# Patient Record
Sex: Female | Born: 1995 | Race: White | Hispanic: No | Marital: Single | Smoking: Never smoker
Health system: Southern US, Community
[De-identification: ages and names within clinical notes are randomized; demographics above are authoritative.]

## PROBLEM LIST (undated history)

## (undated) DIAGNOSIS — Z9109 Other allergy status, other than to drugs and biological substances: Secondary | ICD-10-CM

## (undated) DIAGNOSIS — G40909 Epilepsy, unspecified, not intractable, without status epilepticus: Secondary | ICD-10-CM

## (undated) DIAGNOSIS — J302 Other seasonal allergic rhinitis: Secondary | ICD-10-CM

## (undated) HISTORY — PX: LUMBAR PUNCTURE: SHX1985

## (undated) HISTORY — PX: TONSILLECTOMY: SUR1361

---
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
## 2017-02-03 ENCOUNTER — Telehealth: Payer: Self-pay | Admitting: Physician Assistant

## 2017-02-10 ENCOUNTER — Ambulatory Visit (INDEPENDENT_AMBULATORY_CARE_PROVIDER_SITE_OTHER): Payer: Medicaid Other | Admitting: Physician Assistant

## 2017-02-10 ENCOUNTER — Telehealth: Payer: Self-pay | Admitting: Physician Assistant

## 2017-02-10 VITALS — BP 104/63 | HR 68 | Temp 97.8°F | Ht 63.0 in | Wt 145.0 lb

## 2017-02-10 DIAGNOSIS — R269 Unspecified abnormalities of gait and mobility: Secondary | ICD-10-CM

## 2017-02-10 DIAGNOSIS — R2689 Other abnormalities of gait and mobility: Secondary | ICD-10-CM

## 2017-02-10 DIAGNOSIS — G40909 Epilepsy, unspecified, not intractable, without status epilepticus: Secondary | ICD-10-CM

## 2017-02-10 NOTE — Telephone Encounter (Signed)
Okay to write letter as needed

## 2017-02-11 NOTE — Telephone Encounter (Signed)
Patient mother aware that letter is ready to be picked up.

## 2017-02-15 ENCOUNTER — Encounter: Payer: Self-pay | Admitting: Physician Assistant

## 2017-02-15 DIAGNOSIS — G40309 Generalized idiopathic epilepsy and epileptic syndromes, not intractable, without status epilepticus: Secondary | ICD-10-CM | POA: Insufficient documentation

## 2017-02-15 DIAGNOSIS — R2681 Unsteadiness on feet: Secondary | ICD-10-CM | POA: Insufficient documentation

## 2017-02-15 DIAGNOSIS — R269 Unspecified abnormalities of gait and mobility: Secondary | ICD-10-CM | POA: Insufficient documentation

## 2017-02-15 NOTE — Patient Instructions (Signed)
Health Maintenance, Female Adopting a healthy lifestyle and getting preventive care can go a long way to promote health and wellness. Talk with your health care provider about what schedule of regular examinations is right for you. This is a good chance for you to check in with your provider about disease prevention and staying healthy. In between checkups, there are plenty of things you can do on your own. Experts have done a lot of research about which lifestyle changes and preventive measures are most likely to keep you healthy. Ask your health care provider for more information. Weight and diet Eat a healthy diet  Be sure to include plenty of vegetables, fruits, low-fat dairy products, and lean protein.  Do not eat a lot of foods high in solid fats, added sugars, or salt.  Get regular exercise. This is one of the most important things you can do for your health.  Most adults should exercise for at least 150 minutes each week. The exercise should increase your heart rate and make you sweat (moderate-intensity exercise).  Most adults should also do strengthening exercises at least twice a week. This is in addition to the moderate-intensity exercise. Maintain a healthy weight  Body mass index (BMI) is a measurement that can be used to identify possible weight problems. It estimates body fat based on height and weight. Your health care provider can help determine your BMI and help you achieve or maintain a healthy weight.  For females 76 years of age and older:  A BMI below 18.5 is considered underweight.  A BMI of 18.5 to 24.9 is normal.  A BMI of 25 to 29.9 is considered overweight.  A BMI of 30 and above is considered obese. Watch levels of cholesterol and blood lipids  You should start having your blood tested for lipids and cholesterol at 19 years of age, then have this test every 5 years.  You may need to have your cholesterol levels checked more often if:  Your lipid or  cholesterol levels are high.  You are older than 19 years of age.  You are at high risk for heart disease. Cancer screening Lung Cancer  Lung cancer screening is recommended for adults 64-42 years old who are at high risk for lung cancer because of a history of smoking.  A yearly low-dose CT scan of the lungs is recommended for people who:  Currently smoke.  Have quit within the past 15 years.  Have at least a 30-pack-year history of smoking. A pack year is smoking an average of one pack of cigarettes a day for 1 year.  Yearly screening should continue until it has been 15 years since you quit.  Yearly screening should stop if you develop a health problem that would prevent you from having lung cancer treatment. Breast Cancer  Practice breast self-awareness. This means understanding how your breasts normally appear and feel.  It also means doing regular breast self-exams. Let your health care provider know about any changes, no matter how small.  If you are in your 20s or 30s, you should have a clinical breast exam (CBE) by a health care provider every 1-3 years as part of a regular health exam.  If you are 34 or older, have a CBE every year. Also consider having a breast X-ray (mammogram) every year.  If you have a family history of breast cancer, talk to your health care provider about genetic screening.  If you are at high risk for breast cancer, talk  to your health care provider about having an MRI and a mammogram every year.  Breast cancer gene (BRCA) assessment is recommended for women who have family members with BRCA-related cancers. BRCA-related cancers include:  Breast.  Ovarian.  Tubal.  Peritoneal cancers.  Results of the assessment will determine the need for genetic counseling and BRCA1 and BRCA2 testing. Cervical Cancer  Your health care provider may recommend that you be screened regularly for cancer of the pelvic organs (ovaries, uterus, and vagina).  This screening involves a pelvic examination, including checking for microscopic changes to the surface of your cervix (Pap test). You may be encouraged to have this screening done every 3 years, beginning at age 24.  For women ages 66-65, health care providers may recommend pelvic exams and Pap testing every 3 years, or they may recommend the Pap and pelvic exam, combined with testing for human papilloma virus (HPV), every 5 years. Some types of HPV increase your risk of cervical cancer. Testing for HPV may also be done on women of any age with unclear Pap test results.  Other health care providers may not recommend any screening for nonpregnant women who are considered low risk for pelvic cancer and who do not have symptoms. Ask your health care provider if a screening pelvic exam is right for you.  If you have had past treatment for cervical cancer or a condition that could lead to cancer, you need Pap tests and screening for cancer for at least 20 years after your treatment. If Pap tests have been discontinued, your risk factors (such as having a new sexual partner) need to be reassessed to determine if screening should resume. Some women have medical problems that increase the chance of getting cervical cancer. In these cases, your health care provider may recommend more frequent screening and Pap tests. Colorectal Cancer  This type of cancer can be detected and often prevented.  Routine colorectal cancer screening usually begins at 19 years of age and continues through 19 years of age.  Your health care provider may recommend screening at an earlier age if you have risk factors for colon cancer.  Your health care provider may also recommend using home test kits to check for hidden blood in the stool.  A small camera at the end of a tube can be used to examine your colon directly (sigmoidoscopy or colonoscopy). This is done to check for the earliest forms of colorectal cancer.  Routine  screening usually begins at age 41.  Direct examination of the colon should be repeated every 5-10 years through 19 years of age. However, you may need to be screened more often if early forms of precancerous polyps or small growths are found. Skin Cancer  Check your skin from head to toe regularly.  Tell your health care provider about any new moles or changes in moles, especially if there is a change in a mole's shape or color.  Also tell your health care provider if you have a mole that is larger than the size of a pencil eraser.  Always use sunscreen. Apply sunscreen liberally and repeatedly throughout the day.  Protect yourself by wearing long sleeves, pants, a wide-brimmed hat, and sunglasses whenever you are outside. Heart disease, diabetes, and high blood pressure  High blood pressure causes heart disease and increases the risk of stroke. High blood pressure is more likely to develop in:  People who have blood pressure in the high end of the normal range (130-139/85-89 mm Hg).  People who are overweight or obese.  People who are African American.  If you are 59-24 years of age, have your blood pressure checked every 3-5 years. If you are 34 years of age or older, have your blood pressure checked every year. You should have your blood pressure measured twice-once when you are at a hospital or clinic, and once when you are not at a hospital or clinic. Record the average of the two measurements. To check your blood pressure when you are not at a hospital or clinic, you can use:  An automated blood pressure machine at a pharmacy.  A home blood pressure monitor.  If you are between 29 years and 60 years old, ask your health care provider if you should take aspirin to prevent strokes.  Have regular diabetes screenings. This involves taking a blood sample to check your fasting blood sugar level.  If you are at a normal weight and have a low risk for diabetes, have this test once  every three years after 19 years of age.  If you are overweight and have a high risk for diabetes, consider being tested at a younger age or more often. Preventing infection Hepatitis B  If you have a higher risk for hepatitis B, you should be screened for this virus. You are considered at high risk for hepatitis B if:  You were born in a country where hepatitis B is common. Ask your health care provider which countries are considered high risk.  Your parents were born in a high-risk country, and you have not been immunized against hepatitis B (hepatitis B vaccine).  You have HIV or AIDS.  You use needles to inject street drugs.  You live with someone who has hepatitis B.  You have had sex with someone who has hepatitis B.  You get hemodialysis treatment.  You take certain medicines for conditions, including cancer, organ transplantation, and autoimmune conditions. Hepatitis C  Blood testing is recommended for:  Everyone born from [birth year] through 1963.  Anyone with known risk factors for hepatitis C. Sexually transmitted infections (STIs)  You should be screened for sexually transmitted infections (STIs) including gonorrhea and chlamydia if:  You are sexually active and are younger than 19 years of age.  You are older than 19 years of age and your health care provider tells you that you are at risk for this type of infection.  Your sexual activity has changed since you were last screened and you are at an increased risk for chlamydia or gonorrhea. Ask your health care provider if you are at risk.  If you do not have HIV, but are at risk, it may be recommended that you take a prescription medicine daily to prevent HIV infection. This is called pre-exposure prophylaxis (PrEP). You are considered at risk if:  You are sexually active and do not regularly use condoms or know the HIV status of your partner(s).  You take drugs by injection.  You are sexually active with a partner  who has HIV. Talk with your health care provider about whether you are at high risk of being infected with HIV. If you choose to begin PrEP, you should first be tested for HIV. You should then be tested every 3 months for as long as you are taking PrEP. Pregnancy  If you are premenopausal and you may become pregnant, ask your health care provider about preconception counseling.  If you may become pregnant, take 400 to 800 micrograms (mcg) of folic acid  every day.  If you want to prevent pregnancy, talk to your health care provider about birth control (contraception). Osteoporosis and menopause  Osteoporosis is a disease in which the bones lose minerals and strength with aging. This can result in serious bone fractures. Your risk for osteoporosis can be identified using a bone density scan.  If you are 4 years of age or older, or if you are at risk for osteoporosis and fractures, ask your health care provider if you should be screened.  Ask your health care provider whether you should take a calcium or vitamin D supplement to lower your risk for osteoporosis.  Menopause may have certain physical symptoms and risks.  Hormone replacement therapy may reduce some of these symptoms and risks. Talk to your health care provider about whether hormone replacement therapy is right for you. Follow these instructions at home:  Schedule regular health, dental, and eye exams.  Stay current with your immunizations.  Do not use any tobacco products including cigarettes, chewing tobacco, or electronic cigarettes.  If you are pregnant, do not drink alcohol.  If you are breastfeeding, limit how much and how often you drink alcohol.  Limit alcohol intake to no more than 1 drink per day for nonpregnant women. One drink equals 12 ounces of beer, 5 ounces of wine, or 1 ounces of hard liquor.  Do not use street drugs.  Do not share needles.  Ask your health care provider for help if you need support  or information about quitting drugs.  Tell your health care provider if you often feel depressed.  Tell your health care provider if you have ever been abused or do not feel safe at home. This information is not intended to replace advice given to you by your health care provider. Make sure you discuss any questions you have with your health care provider. Document Released: 06/15/2011 Document Revised: 05/07/2016 Document Reviewed: 09/03/2015 Elsevier Interactive Patient Education  2017 [employer].

## 2017-02-15 NOTE — Progress Notes (Signed)
BP 104/63   Pulse 68   Temp 97.8 F (36.6 C) (Oral)   Ht 5\' 3"  (1.6 m)   Wt 145 lb (65.8 kg)   BMI 25.69 kg/m    Subjective:    Patient ID: Christine Marsh, female    DOB: 1996-06-08, 21 y.o.   MRN: [id number]  Christine Marsh is a 21 y.o. female presenting on 02/10/2017 for Well Child  HPI this is a new patient to me today. She comes in for several problems and to have a [employer] form completed. She has epilepsy, and a gait abnormality. She was seen through physical therapy to try to improve her gait. There was some difficulty with insurance not paying for it. We will put in a new referral for this to see if she is able to go. At this time she is not expressing any problems. Her medications are keeping her seizure-free. She does see a neurologist. All of her medications and diagnoses are reviewed today. A formal be completed for her to participate in the [employer].  Past Medical History:  Diagnosis Date  . Environmental allergies   . Epilepsy (HCC)   . Seasonal allergies    Relevant past medical, surgical, family and social history reviewed and updated as indicated. Interim medical history since our last visit reviewed. Allergies and medications reviewed and updated.   Data reviewed from any sources in EPIC.  Review of Systems  Constitutional: Negative.  Negative for activity change, fatigue and fever.  HENT: Negative.   Eyes: Negative.   Respiratory: Negative.  Negative for cough.   Cardiovascular: Negative.  Negative for chest pain.  Gastrointestinal: Negative.  Negative for abdominal pain.  Endocrine: Negative.   Genitourinary: Negative.  Negative for dysuria.  Musculoskeletal: Negative.   Skin: Negative.   Neurological: Negative.      Social History   Social History  . Marital status: Single    Spouse name: N/A  . Number of children: N/A  . Years of education: N/A   Occupational History  . Not on file.   Social History Main Topics  . Smoking  status: Never Smoker  . Smokeless tobacco: Never Used  . Alcohol use No  . Drug use: No  . Sexual activity: Not on file   Other Topics Concern  . Not on file   Social History Narrative  . No narrative on file    Past Surgical History:  Procedure Laterality Date  . LUMBAR PUNCTURE    . TONSILLECTOMY      No family history on file.  Allergies as of 02/10/2017      Reactions   Oysters [shellfish Allergy] Nausea And Vomiting   Phenobarbital Rash, Other (See Comments)   Kidney and yeast infection      Medication List       Accurate as of 02/10/17 11:59 PM. Always use your most recent med list.          ibuprofen 200 MG tablet Commonly known as:  ADVIL,MOTRIN Take 200 mg by mouth 2 (two) times daily as needed for pain or headache.   lamoTRIgine 200 MG tablet Commonly known as:  LAMICTAL Take 200 mg by mouth 2 (two) times daily.   lamoTRIgine 25 MG tablet Commonly known as:  LAMICTAL Take 50 mg by mouth.   levETIRAcetam 1000 MG tablet Commonly known as:  KEPPRA Take 2,000 mg by mouth.   loratadine 10 MG tablet Commonly known as:  CLARITIN Take 10 mg  by mouth every morning.          Objective:    There were no vitals taken for this visit.  Allergies  Allergen Reactions  . Oysters [Shellfish Allergy] Nausea And Vomiting  . Phenobarbital Rash and Other (See Comments)    Kidney and yeast infection   Wt Readings from Last 3 Encounters:  07/08/16 148 lb (67.1 kg) (83 %, Z= 0.97)*  08/23/15 139 lb 11.2 oz (63.4 kg) (78 %, Z= 0.78)*  01/01/15 160 lb (72.6 kg) (92 %, Z= 1.39)*   * Growth percentiles are based on CDC 2-20 Years data.    BP 104/63   Pulse 68   Temp 97.8 F (36.6 C) (Oral)   Ht 5\' 3"  (1.6 m)   Wt 145 lb (65.8 kg)   BMI 25.69 kg/m   Physical Exam  Constitutional: She is oriented to person, place, and time. She appears well-developed and well-nourished.  HENT:  Head: Normocephalic and atraumatic.  Right Ear: Tympanic membrane,  external ear and ear canal normal.  Left Ear: Tympanic membrane, external ear and ear canal normal.  Nose: Nose normal. No rhinorrhea.  Mouth/Throat: Oropharynx is clear and moist and mucous membranes are normal. No oropharyngeal exudate or posterior oropharyngeal erythema.  Eyes: Conjunctivae and EOM are normal. Pupils are equal, round, and reactive to light.  Neck: Normal range of motion. Neck supple.  Cardiovascular: Normal rate, regular rhythm, normal heart sounds and intact distal pulses.   Pulmonary/Chest: Effort normal and breath sounds normal.  Abdominal: Soft. Bowel sounds are normal.  Musculoskeletal: She exhibits no edema or deformity.  Abnormal gait when walking, some tripping in walk mode, able to clear better when running.  Neurological: She is alert and oriented to person, place, and time. She has normal reflexes.  Skin: Skin is warm and dry. No rash noted.  Psychiatric: She has a normal mood and affect. Her behavior is normal. Judgment and thought content normal.        Assessment & Plan:   1. Nonintractable epilepsy without status epilepticus, unspecified epilepsy type (HCC) - lamoTRIgine (LAMICTAL) 25 MG tablet; Take 50 mg by mouth. - levETIRAcetam (KEPPRA) 1000 MG tablet; Take 2,000 mg by mouth.  2. Abnormality of gait and mobility - Ambulatory referral to Physical Therapy  3. Functional gait abnormality - Ambulatory referral to Physical Therapy   4. SPORTS form for [employer] completed today   Continue all other maintenance medications as listed above. Educational handout given for health maintenance  Follow up plan: No Follow-up on file.  Remus LofflerAngel S. Shjon Lizarraga PA-C [facility] [address]  [place], [place] [address] [phone]   02/15/2017, 11:35 AM

## 2017-02-22 ENCOUNTER — Ambulatory Visit: Payer: Medicaid Other | Admitting: Physical Therapy

## 2017-03-01 ENCOUNTER — Ambulatory Visit: Payer: Medicaid Other | Attending: Physician Assistant | Admitting: Physical Therapy

## 2017-03-11 ENCOUNTER — Telehealth: Payer: Self-pay | Admitting: Physician Assistant

## 2017-03-11 MED ORDER — PERMETHRIN 5 % EX CREA: 1.0000 "application " | TOPICAL_CREAM | Freq: Once | CUTANEOUS | 0 refills | Status: AC

## 2017-03-11 NOTE — Telephone Encounter (Signed)
Patient had filled out at appointment.

## 2017-03-11 NOTE — Telephone Encounter (Signed)
Covering for PCP  Permethrin sent to [facility].   Murtis SinkSam Bradshaw, MD [facility] 03/11/2017, 12:25 PM

## 2017-03-11 NOTE — Telephone Encounter (Signed)
Pt aware Rx has been sent to pharmacy 

## 2017-03-18 ENCOUNTER — Telehealth: Payer: Self-pay | Admitting: Physician Assistant

## 2017-03-19 ENCOUNTER — Telehealth: Payer: Self-pay | Admitting: Physician Assistant

## 2017-03-19 NOTE — Telephone Encounter (Signed)
Ok, will forward to start process with [facility] coordinator.   Murtis Sink, MD [facility] 03/19/2017, 11:50 AM

## 2017-03-19 NOTE — Telephone Encounter (Signed)
Hospital bed order and OV notes sent to [facility]

## 2017-03-19 NOTE — Telephone Encounter (Signed)
Christine Marsh pt Pt needs hospital bed due to falling out of bed due to seizures Please advise

## 2017-03-22 ENCOUNTER — Telehealth: Payer: Self-pay | Admitting: Physician Assistant

## 2017-03-22 NOTE — Telephone Encounter (Signed)
LMOM, all paperwork was sent to [facility] on Friday afternoon

## 2017-04-05 ENCOUNTER — Telehealth: Payer: Self-pay | Admitting: Physician Assistant

## 2017-04-05 MED ORDER — LORATADINE 10 MG PO TABS: 10.0000 mg | ORAL_TABLET | Freq: Every morning | ORAL | 5 refills | Status: DC

## 2017-04-05 NOTE — Telephone Encounter (Signed)
What symptoms do you have? Sneezing, cough, weak  How long have you been sick? 3-4 days  Have you been seen for this problem? no  If your provider decides to give you a prescription, which pharmacy would you like for it to be sent to? [facility] [place]   Patient informed that this information will be sent to the clinical staff for review and that they should receive a follow up call.

## 2017-04-05 NOTE — Telephone Encounter (Signed)
Aware that Rx was sent into pharmacy

## 2017-04-05 NOTE — Telephone Encounter (Signed)
Cough - non productive, white mucous No fever, 3-4 days. Headache OTC extra strength tylenol Not able to afford claritin at this time Would like something called into [facility] pharmacy

## 2017-05-11 ENCOUNTER — Emergency Department (HOSPITAL_COMMUNITY)
Admission: EM | Admit: 2017-05-11 | Discharge: 2017-05-11 | Disposition: A | Discharge status: Home / Self Care | Payer: Medicaid Other | Attending: Emergency Medicine | Admitting: Emergency Medicine

## 2017-05-11 ENCOUNTER — Encounter (HOSPITAL_COMMUNITY): Payer: Self-pay | Admitting: Emergency Medicine

## 2017-05-11 DIAGNOSIS — W01198A Fall on same level from slipping, tripping and stumbling with subsequent striking against other object, initial encounter: Secondary | ICD-10-CM | POA: Diagnosis not present

## 2017-05-11 DIAGNOSIS — S0101XA Laceration without foreign body of scalp, initial encounter: Secondary | ICD-10-CM

## 2017-05-11 DIAGNOSIS — Y939 Activity, unspecified: Secondary | ICD-10-CM | POA: Insufficient documentation

## 2017-05-11 DIAGNOSIS — Y929 Unspecified place or not applicable: Secondary | ICD-10-CM | POA: Insufficient documentation

## 2017-05-11 DIAGNOSIS — Y999 Unspecified external cause status: Secondary | ICD-10-CM | POA: Diagnosis not present

## 2017-05-11 MED ORDER — ACETAMINOPHEN 325 MG PO TABS
650.0000 mg | ORAL_TABLET | Freq: Once | ORAL | Status: AC
  Administered 2017-05-11: 650 mg via ORAL
  Filled 2017-05-11: qty 2

## 2017-05-11 NOTE — Discharge Instructions (Signed)
Keep the wound clean and dry as possible.  You can apply ice packs on/off as needed for swelling.  Tylenol or ibuprofen if needed.  Follow-up with her doctor for recheck.  Return here if needed.

## 2017-05-11 NOTE — ED Triage Notes (Signed)
Pt tripped and fell. Hit back of head. Small lac noted with bleeding controlled. Denies loc or seizure activity. Nad. Steady gait

## 2017-05-11 NOTE — ED Notes (Signed)
Pt alert & oriented x4, stable gait. Patient given discharge instructions, paperwork & prescription(s). Patient  instructed to stop at the registration desk to finish any additional paperwork. Patient verbalized understanding. Pt left department w/ no further questions. 

## 2017-05-11 NOTE — ED Notes (Signed)
Pt states she tripped over her own feet, hitting her head on the table. No LOC. Neuro intact. Small abrasion noted to the back of the head. Abrasion cleaned w/ normal saline.

## 2017-05-11 NOTE — ED Provider Notes (Signed)
[facility] Provider Note   CSN: [id number] Arrival date & time: 05/11/17  1811     History   Chief Complaint Chief Complaint  Patient presents with  . Fall    HPI Christine Marsh is a 21 y.o. female.  HPI   Christine Marsh is a 21 y.o. female who presents to the Emergency Department complaining of laceration to the scalp and mechanical fall that occurred prior to ER arrival.  She states that she tripped and fell backwards striking the back of her head on a coffee table.  She denies headache, LOC, neck pain, vomiting or visual changes.  Bleeding controlled prior to arrival.  Last Td is current.     Past Medical History:  Diagnosis Date  . Environmental allergies   . Epilepsy (HCC)   . Seasonal allergies     Patient Active Problem List   Diagnosis Date Noted  . Nonintractable epilepsy without status epilepticus (HCC) 02/15/2017  . Abnormality of gait and mobility 02/15/2017  . Functional gait abnormality 02/15/2017    Past Surgical History:  Procedure Laterality Date  . LUMBAR PUNCTURE    . TONSILLECTOMY      OB History    No data available       Home Medications    Prior to Admission medications   Medication Sig Start Date End Date Taking? Authorizing Provider  ibuprofen (ADVIL,MOTRIN) 200 MG tablet Take 200 mg by mouth 2 (two) times daily as needed for pain or headache.    [provider]  lamoTRIgine (LAMICTAL) 200 MG tablet Take 200 mg by mouth 2 (two) times daily. 11/23/14   [provider]  lamoTRIgine (LAMICTAL) 25 MG tablet Take 50 mg by mouth. 11/26/16   [provider]  levETIRAcetam (KEPPRA) 1000 MG tablet Take 2,000 mg by mouth. 11/26/16   [provider]  loratadine (CLARITIN) 10 MG tablet Take 1 tablet (10 mg total) by mouth every morning. 04/05/17   Remus LofflerJones, Angel S, PA-C    Family History History reviewed. No pertinent family history.  Social History Social History  Substance Use Topics  . Smoking  status: Never Smoker  . Smokeless tobacco: Never Used  . Alcohol use No     Allergies   Oysters [shellfish allergy] and Phenobarbital   Review of Systems Review of Systems  Constitutional: Negative for activity change, chills and fever.  Eyes: Negative for visual disturbance.  Respiratory: Negative for shortness of breath.   Cardiovascular: Negative for chest pain.  Gastrointestinal: Negative for nausea and vomiting.  Musculoskeletal: Negative for arthralgias, back pain, joint swelling and neck pain.  Skin: Positive for wound.       Laceration scalp   Neurological: Negative for dizziness, syncope, speech difficulty, weakness, numbness and headaches.  Hematological: Does not bruise/bleed easily.  Psychiatric/Behavioral: Negative for confusion.  All other systems reviewed and are negative.    Physical Exam Updated Vital Signs BP (!) 106/54   Pulse 95   Temp 98.3 F (36.8 C) (Oral)   Resp 17   Wt 70.3 kg (155 lb)   LMP 03/31/2017   SpO2 98%   BMI 27.46 kg/m   Physical Exam  Constitutional: She is oriented to person, place, and time. She appears well-developed and well-nourished. No distress.  HENT:  Head: Normocephalic. Head is with laceration.    Mouth/Throat: Oropharynx is clear and moist.  1 cm superficial laceration to the occipital scalp.  No hematoma.  bleeding controlled  Eyes: Conjunctivae and EOM are  normal. Pupils are equal, round, and reactive to light.  Neck: Normal range of motion. Neck supple.  Cardiovascular: Normal rate, regular rhythm, normal heart sounds and intact distal pulses.   No murmur heard. Pulmonary/Chest: Effort normal and breath sounds normal. No respiratory distress.  Musculoskeletal: She exhibits no edema or tenderness.  Neurological: She is alert and oriented to person, place, and time. No sensory deficit. She exhibits normal muscle tone. Coordination normal.  Skin: Skin is warm. Capillary refill takes less than 2 seconds.  Laceration noted.  Psychiatric: She has a normal mood and affect. Thought content normal.  Nursing note and vitals reviewed.    ED Treatments / Results  Labs (all labs ordered are listed, but only abnormal results are displayed) Labs Reviewed - No data to display  EKG  EKG Interpretation None       Radiology No results found.  Procedures Procedures (including critical care time)   LACERATION REPAIR Performed by: Jansen Goodpasture L. Authorized by: Maxwell Caul Consent: Verbal consent obtained. Risks and benefits: risks, benefits and alternatives were discussed Consent given by: patient Patient identity confirmed: provided demographic data Prepped and Draped in normal sterile fashion Wound explored  Laceration Location: posterior scalp  Laceration Length: 1 cm  No Foreign Bodies seen or palpated  Anesthesia: none Local anesthetic: none  Irrigation method: syringe Amount of cleaning: standard  Skin closure: tissue adhesive  Technique: topical application  Patient tolerance: Patient tolerated the procedure well with no immediate complications.  Medications Ordered in ED Medications  acetaminophen (TYLENOL) tablet 650 mg (not administered)     Initial Impression / Assessment and Plan / ED Course  I have reviewed the triage vital signs and the nursing notes.  Pertinent labs & imaging results that were available during my care of the patient were reviewed by me and considered in my medical decision making (see chart for details).     Pt is alert, ambulates with a steady gait.  Agrees to head injury instructions.  Return precautions discussed.   Final Clinical Impressions(s) / ED Diagnoses   Final diagnoses:  Laceration of scalp, initial encounter    New Prescriptions New Prescriptions   No medications on file     Pauline Aus, Cordelia Poche 05/13/17 1301    Loren Racer, MD 05/20/17 [phone]

## 2017-05-17 ENCOUNTER — Telehealth: Payer: Self-pay | Admitting: Physician Assistant

## 2017-05-17 NOTE — Telephone Encounter (Signed)
Pt notified of RX 

## 2017-05-17 NOTE — Telephone Encounter (Signed)
What is the name of the medication? clartin  Have you contacted your pharmacy to request a refill? yes  Which pharmacy would you like this sent to? [facility] [place]   Patient notified that their request is being sent to the clinical staff for review and that they should receive a call once it is complete. If they do not receive a call within 24 hours they can check with their pharmacy or our office.

## 2017-07-21 ENCOUNTER — Telehealth: Payer: Self-pay | Admitting: Physician Assistant

## 2017-07-21 MED ORDER — SULFAMETHOXAZOLE-TRIMETHOPRIM 800-160 MG PO TABS: 1.0000 | ORAL_TABLET | Freq: Two times a day (BID) | ORAL | 0 refills | Status: DC

## 2017-07-21 NOTE — Telephone Encounter (Signed)
Mother aware

## 2017-07-21 NOTE — Telephone Encounter (Signed)
Mother called and stated that she is sure that her daughter has a UTI. It is very difficult for her to urinate but she is having her period and does not want to come to the Dr until it is finished. Mom wants to know if meds can be called in and if no antibiotics then something for pain until her period is over and she can come to office. Please advise and route to pool A

## 2017-10-11 ENCOUNTER — Other Ambulatory Visit: Payer: Self-pay | Admitting: Physician Assistant

## 2017-10-12 NOTE — Telephone Encounter (Signed)
Last seen 02/10/17   Christine Marsh 

## 2017-10-22 DIAGNOSIS — Z0289 Encounter for other administrative examinations: Secondary | ICD-10-CM

## 2017-10-27 ENCOUNTER — Telehealth: Payer: Self-pay | Admitting: *Deleted

## 2017-10-27 NOTE — Telephone Encounter (Signed)
[facility] Per Prudy FeelerAngel Jones, PA-C patient NTBS Ph# [phone] grandmother states she does not live at this number Ph# [phone] VM is full [facility] they have the same above numbers

## 2017-11-09 ENCOUNTER — Ambulatory Visit: Payer: Self-pay | Admitting: Physician Assistant

## 2017-11-12 ENCOUNTER — Other Ambulatory Visit: Payer: Self-pay | Admitting: Physician Assistant

## 2017-11-12 NOTE — Telephone Encounter (Signed)
Last seen 02/10/17   [facility]

## 2018-01-31 ENCOUNTER — Ambulatory Visit: Payer: Medicaid Other | Admitting: Physician Assistant

## 2018-02-01 ENCOUNTER — Ambulatory Visit: Payer: Medicaid Other | Admitting: Physician Assistant

## 2018-02-04 ENCOUNTER — Ambulatory Visit (INDEPENDENT_AMBULATORY_CARE_PROVIDER_SITE_OTHER): Payer: Medicaid Other | Admitting: Family Medicine

## 2018-02-04 ENCOUNTER — Encounter: Payer: Self-pay | Admitting: Family Medicine

## 2018-02-04 VITALS — BP 112/68 | HR 86 | Temp 97.9°F | Ht 63.0 in | Wt 160.0 lb

## 2018-02-04 DIAGNOSIS — R51 Headache: Secondary | ICD-10-CM

## 2018-02-04 DIAGNOSIS — Z4802 Encounter for removal of sutures: Secondary | ICD-10-CM

## 2018-02-04 DIAGNOSIS — S0181XA Laceration without foreign body of other part of head, initial encounter: Secondary | ICD-10-CM

## 2018-02-04 MED ORDER — IBUPROFEN 400 MG PO TABS: 400.0000 mg | ORAL_TABLET | Freq: Two times a day (BID) | ORAL | 0 refills | Status: AC | PRN

## 2018-02-04 MED ORDER — IBUPROFEN 400 MG PO TABS: 400.0000 mg | ORAL_TABLET | Freq: Two times a day (BID) | ORAL | 0 refills | Status: DC | PRN

## 2018-02-04 NOTE — Progress Notes (Signed)
Subjective: CC: Suture removal PCP: Terald Sleeper, PA-C Christine Marsh is a 22 y.o. female, who is accompanied by her mother today's visit. She is presenting to clinic today for:  1.  Suture removal Patient sustained a laceration to the right parietal about 3 weeks ago.  She had staples placed in the emergency department.  Unfortunately there are no records here for review.  She notes that she does have intermittently have headaches.  She is frequently hit her head as a result of seizure-like activity.  However, she notes that laceration sustained 3 weeks ago was due to someone tripping her and her falling and hitting a bookcase.  She denies focal neurologic deficits.  She notes that headaches are well relieved by ibuprofen.  She would like a refill on this if possible.  Denies fevers, chills, purulence from the lesion on her scalp.  She has been showering normally and has been doing well.   ROS: Per HPI  Allergies  Allergen Reactions  . Oysters [Shellfish Allergy] Nausea And Vomiting  . Phenobarbital Rash and Other (See Comments)    Kidney and yeast infection   Past Medical History:  Diagnosis Date  . Environmental allergies   . Epilepsy ([place])   . Seasonal allergies     Current Outpatient Medications:  .  ALLERGY NON-DROWSY 10 MG tablet, TAKE 1 TABLET BY MOUTH EVERY MORNING., Disp: 30 tablet, Rfl: 0 .  ibuprofen (ADVIL,MOTRIN) 200 MG tablet, Take 200 mg by mouth 2 (two) times daily as needed for pain or headache., Disp: , Rfl:  .  lamoTRIgine (LAMICTAL) 200 MG tablet, Take 200 mg by mouth 2 (two) times daily., Disp: , Rfl:  .  lamoTRIgine (LAMICTAL) 25 MG tablet, Take 50 mg by mouth., Disp: , Rfl:  .  levETIRAcetam (KEPPRA) 1000 MG tablet, Take 2,000 mg by mouth., Disp: , Rfl:  .  sulfamethoxazole-trimethoprim (BACTRIM DS) 800-160 MG tablet, Take 1 tablet by mouth 2 (two) times daily., Disp: 14 tablet, Rfl: 0 Social History   Socioeconomic History  . Marital status:  Single    Spouse name: Not on file  . Number of children: Not on file  . Years of education: Not on file  . Highest education level: Not on file  Social Needs  . Financial resource strain: Not on file  . Food insecurity - worry: Not on file  . Food insecurity - inability: Not on file  . Transportation needs - medical: Not on file  . Transportation needs - non-medical: Not on file  Occupational History  . Not on file  Tobacco Use  . Smoking status: Never Smoker  . Smokeless tobacco: Never Used  Substance and Sexual Activity  . Alcohol use: No  . Drug use: No  . Sexual activity: Not on file  Other Topics Concern  . Not on file  Social History Narrative  . Not on file   No family history on file.  Objective: Office vital signs reviewed. BP 112/68   Pulse 86   Temp 97.9 F (36.6 C) (Oral)   Ht '5\' 3"'$  (1.6 m)   Wt 160 lb (72.6 kg)   BMI 28.34 kg/m   Physical Examination:  General: Awake, alert, well nourished, No acute distress HEENT: Proximally 1 inch well-healed scalp laceration appreciated on right parietal scalp noted.  There are 3 staples in place.  No surrounding erythema, exudate, purulence or bleeding. Skin: see above Neuro: Follows commands, no focal deficits Psych: Very child like behavior during  today's exam.  Fidgeting.  Verbal consent obtained to remove scalp staples today.  Staple removal kit used.  No bleeding appreciated.  Patient tolerated procedure without difficulty.   Assessment/ Plan: 22 y.o. female   1. Encounter for staple removal Tolerated procedure without difficulty.  No immediate complications.  No evidence of secondary infection.  May resume normal activities, including bathing/showering.  Motrin 400 mg p.o. twice daily as needed headache prescribed.   Meds ordered this encounter  Medications  . DISCONTD: ibuprofen (ADVIL,MOTRIN) 400 MG tablet    Sig: Take 1 tablet (400 mg total) by mouth 2 (two) times daily as needed for headache.     Dispense:  20 tablet    Refill:  0  . ibuprofen (ADVIL,MOTRIN) 400 MG tablet    Sig: Take 1 tablet (400 mg total) by mouth 2 (two) times daily as needed for headache.    Dispense:  20 tablet    Refill:  [place], DO [place] [phone]

## 2018-02-04 NOTE — Patient Instructions (Signed)
You may resume normal bathing activities.

## 2018-03-24 ENCOUNTER — Telehealth: Payer: Self-pay | Admitting: Physician Assistant

## 2018-03-24 ENCOUNTER — Other Ambulatory Visit: Payer: Self-pay | Admitting: *Deleted

## 2018-03-24 DIAGNOSIS — G40909 Epilepsy, unspecified, not intractable, without status epilepticus: Secondary | ICD-10-CM

## 2018-03-24 MED ORDER — LEVETIRACETAM 1000 MG PO TABS: 2000.0000 mg | ORAL_TABLET | Freq: Two times a day (BID) | ORAL | 0 refills | Status: DC

## 2018-03-24 NOTE — Telephone Encounter (Addendum)
See note from nursing below.   Covering for PCP  Will ask that she request from neuro, we can prescribe but they should not have a problem with it if she is established there.   If they require follow up it is unusual that it would take so long to get in  If we prescribe please call pharmacy to confirm what she is filling regularly.   Murtis SinkSam Bradshaw, MD [facility] 03/24/2018, 11:58 AM   Per nursing:  Medication Problem (Pt needs her seizure medication, pt had an appt with Dr. Elwanda Brooklynruell but social worker cancelled appt the day of appt and can not get her in until August wants to know ir Lawanna Kobusngel can prescribe it she did not go to school and has prom on sat., [facility])

## 2018-03-24 NOTE — Progress Notes (Signed)
Pt not established with neurologist yet RX sent to pharmacy Okayed per Dr Ermalinda MemosBradshaw Pt notified

## 2018-03-31 ENCOUNTER — Encounter: Payer: Self-pay | Admitting: Physician Assistant

## 2018-03-31 ENCOUNTER — Ambulatory Visit (INDEPENDENT_AMBULATORY_CARE_PROVIDER_SITE_OTHER): Payer: Medicaid Other | Admitting: Physician Assistant

## 2018-03-31 VITALS — BP 97/62 | HR 95 | Temp 97.9°F | Ht 63.0 in | Wt 160.4 lb

## 2018-03-31 DIAGNOSIS — G4700 Insomnia, unspecified: Secondary | ICD-10-CM

## 2018-03-31 DIAGNOSIS — G40909 Epilepsy, unspecified, not intractable, without status epilepticus: Secondary | ICD-10-CM

## 2018-03-31 MED ORDER — LEVETIRACETAM 1000 MG PO TABS: 2000.0000 mg | ORAL_TABLET | Freq: Two times a day (BID) | ORAL | 5 refills | Status: DC

## 2018-03-31 MED ORDER — LAMOTRIGINE 200 MG PO TABS: 200.0000 mg | ORAL_TABLET | Freq: Two times a day (BID) | ORAL | 5 refills | Status: DC

## 2018-03-31 MED ORDER — LAMOTRIGINE 25 MG PO TABS: 50.0000 mg | ORAL_TABLET | Freq: Every day | ORAL | 5 refills | Status: DC

## 2018-04-03 NOTE — Progress Notes (Signed)
BP 97/62   Pulse 95   Temp 97.9 F (36.6 C) (Oral)   Ht 5\' 3"  (1.6 m)   Wt 160 lb 6.4 oz (72.8 kg)   BMI 28.41 kg/m    Subjective:    Patient ID: Christine Marsh, female    DOB: 1996/01/06, 22 y.o.   MRN: [id number]  HPI: Christine Marsh is a 22 y.o. female presenting on 03/31/2018 for Medication Refill  Patient comes in for recheck on her seizure disorder.  It is been almost a year since she has been seen for her regular refills of medications.  She will be getting a neurologist because she is over 20 now.  Her appointment is supposed to be in September.  In the meantime we will refill her medications.  She is present here with her mother.   Past Medical History:  Diagnosis Date  . Environmental allergies   . Epilepsy (HCC)   . Seasonal allergies    Relevant past medical, surgical, family and social history reviewed and updated as indicated. Interim medical history since our last visit reviewed. Allergies and medications reviewed and updated. DATA REVIEWED: CHART IN EPIC  Family History reviewed for pertinent findings.  Review of Systems  Constitutional: Negative.   HENT: Negative.   Eyes: Negative.   Respiratory: Negative.   Gastrointestinal: Negative.   Genitourinary: Negative.     Allergies as of 03/31/2018      Reactions   Oysters [shellfish Allergy] Nausea And Vomiting   Phenobarbital Rash, Other (See Comments)   Kidney and yeast infection      Medication List        Accurate as of 03/31/18 11:59 PM. Always use your most recent med list.          ALLERGY NON-DROWSY 10 MG tablet Generic drug:  loratadine TAKE 1 TABLET BY MOUTH EVERY MORNING.   ibuprofen 400 MG tablet Commonly known as:  ADVIL,MOTRIN Take 1 tablet (400 mg total) by mouth 2 (two) times daily as needed for headache.   lamoTRIgine 200 MG tablet Commonly known as:  LAMICTAL Take 1 tablet (200 mg total) by mouth 2 (two) times daily.   lamoTRIgine 25 MG tablet Commonly known as:   LAMICTAL Take 2 tablets (50 mg total) by mouth daily.   levETIRAcetam 1000 MG tablet Commonly known as:  KEPPRA Take 2 tablets (2,000 mg total) by mouth 2 (two) times daily.          Objective:    BP 97/62   Pulse 95   Temp 97.9 F (36.6 C) (Oral)   Ht 5\' 3"  (1.6 m)   Wt 160 lb 6.4 oz (72.8 kg)   BMI 28.41 kg/m   Allergies  Allergen Reactions  . Oysters [Shellfish Allergy] Nausea And Vomiting  . Phenobarbital Rash and Other (See Comments)    Kidney and yeast infection    Wt Readings from Last 3 Encounters:  03/31/18 160 lb 6.4 oz (72.8 kg) (88 %, Z= 1.17)*  02/04/18 160 lb (72.6 kg) (88 %, Z= 1.17)*  05/11/17 155 lb (70.3 kg) (87 %, Z= 1.10)*   * Growth percentiles are based on CDC (Girls, 2-20 Years) data.    Physical Exam  Constitutional: She is oriented to person, place, and time. She appears well-developed and well-nourished.  HENT:  Head: Normocephalic and atraumatic.  Eyes: Pupils are equal, round, and reactive to light. Conjunctivae and EOM are normal.  Cardiovascular: Normal rate, regular rhythm, normal heart sounds and intact  distal pulses.  Pulmonary/Chest: Effort normal and breath sounds normal.  Abdominal: Soft. Bowel sounds are normal.  Neurological: She is alert and oriented to person, place, and time. She has normal reflexes.  Skin: Skin is warm and dry. No rash noted.  Psychiatric: She has a normal mood and affect. Her behavior is normal. Judgment and thought content normal.        Assessment & Plan:   1. Nonintractable epilepsy without status epilepticus, unspecified epilepsy type (HCC) - lamoTRIgine (LAMICTAL) 200 MG tablet; Take 1 tablet (200 mg total) by mouth 2 (two) times daily.  Dispense: 30 tablet; Refill: 5 - lamoTRIgine (LAMICTAL) 25 MG tablet; Take 2 tablets (50 mg total) by mouth daily.  Dispense: 60 tablet; Refill: 5 - levETIRAcetam (KEPPRA) 1000 MG tablet; Take 2 tablets (2,000 mg total) by mouth 2 (two) times daily.  Dispense: 120  tablet; Refill: 5  2. Insomnia, unspecified type   Continue all other maintenance medications as listed above.  Follow up plan: Return in about 6 months (around 09/30/2018) for recheck.  Educational handout given for survey  Remus LofflerAngel S. Bartlett Enke PA-C [facility] [address]  [place], [place] [id number] [phone]   04/03/2018, 11:20 PM

## 2018-06-28 ENCOUNTER — Encounter (HOSPITAL_COMMUNITY): Payer: Self-pay

## 2018-06-28 ENCOUNTER — Emergency Department (HOSPITAL_COMMUNITY): Payer: Medicaid Other

## 2018-06-28 ENCOUNTER — Other Ambulatory Visit: Payer: Self-pay

## 2018-06-28 ENCOUNTER — Emergency Department (HOSPITAL_COMMUNITY)
Admission: EM | Admit: 2018-06-28 | Discharge: 2018-06-28 | Disposition: A | Discharge status: Home / Self Care | Payer: Medicaid Other | Attending: Emergency Medicine | Admitting: Emergency Medicine

## 2018-06-28 DIAGNOSIS — G40909 Epilepsy, unspecified, not intractable, without status epilepticus: Secondary | ICD-10-CM | POA: Diagnosis not present

## 2018-06-28 DIAGNOSIS — F79 Unspecified intellectual disabilities: Secondary | ICD-10-CM | POA: Insufficient documentation

## 2018-06-28 DIAGNOSIS — Z79899 Other long term (current) drug therapy: Secondary | ICD-10-CM | POA: Diagnosis not present

## 2018-06-28 DIAGNOSIS — J302 Other seasonal allergic rhinitis: Secondary | ICD-10-CM | POA: Diagnosis not present

## 2018-06-28 DIAGNOSIS — R569 Unspecified convulsions: Secondary | ICD-10-CM

## 2018-06-28 LAB — I-STAT BETA HCG BLOOD, ED (MC, WL, AP ONLY): I-stat hCG, quantitative: <5 m[IU]/mL (ref <5)

## 2018-06-28 LAB — CBC WITH DIFFERENTIAL/PLATELET
Abs Immature Granulocytes: 0 10*3/uL (ref 0.0–0.1)
Basophils Absolute: 0 10*3/uL (ref 0.0–0.1)
Basophils Relative: 0 %
Eosinophils Absolute: 0.1 10*3/uL (ref 0.0–0.7)
Eosinophils Relative: 1 %
HCT: 38.7 % (ref 36.0–46.0)
Hemoglobin: 13.2 g/dL (ref 12.0–15.0)
Immature Granulocytes: 0 %
Lymphocytes Relative: 14 %
Lymphs Abs: 1.3 10*3/uL (ref 0.7–4.0)
MCH: 30.9 pg (ref 26.0–34.0)
MCHC: 34.1 g/dL (ref 30.0–36.0)
MCV: 90.6 fL (ref 78.0–100.0)
Monocytes Absolute: 0.5 10*3/uL (ref 0.1–1.0)
Monocytes Relative: 5 %
Neutro Abs: 7.8 10*3/uL — ABNORMAL HIGH (ref 1.7–7.7)
Neutrophils Relative %: 80 %
Platelets: 253 10*3/uL (ref 150–400)
RBC: 4.27 MIL/uL (ref 3.87–5.11)
RDW: 11.9 % (ref 11.5–15.5)
WBC: 9.7 10*3/uL (ref 4.0–10.5)

## 2018-06-28 LAB — VALPROIC ACID LEVEL: Valproic Acid Lvl: <10 ug/mL — ABNORMAL LOW (ref 50.0–100.0)

## 2018-06-28 LAB — BASIC METABOLIC PANEL
Anion gap: 8 (ref 5–15)
BUN: 7 mg/dL (ref 6–20)
CO2: 26 mmol/L (ref 22–32)
Calcium: 9.2 mg/dL (ref 8.9–10.3)
Chloride: 106 mmol/L (ref 98–111)
Creatinine, Ser: 0.62 mg/dL (ref 0.44–1.00)
GFR calc Af Amer: >60 mL/min (ref >60)
GFR calc non Af Amer: >60 mL/min (ref >60)
Glucose, Bld: 97 mg/dL (ref 70–99)
Potassium: 4 mmol/L (ref 3.5–5.1)
Sodium: 140 mmol/L (ref 135–145)

## 2018-06-28 LAB — CBG MONITORING, ED: Glucose-Capillary: 99 mg/dL (ref 70–99)

## 2018-06-28 MED ORDER — SODIUM CHLORIDE 0.9 % IV SOLN
INTRAVENOUS | Status: DC
  Administered 2018-06-28: 20:00:00 via INTRAVENOUS

## 2018-06-28 MED ORDER — LAMOTRIGINE 200 MG PO TABS: 200.0000 mg | ORAL_TABLET | Freq: Two times a day (BID) | ORAL | 0 refills | Status: DC

## 2018-06-28 MED ORDER — LAMOTRIGINE 25 MG PO TABS: 50.0000 mg | ORAL_TABLET | Freq: Every day | ORAL | 0 refills | Status: DC

## 2018-06-28 MED ORDER — LAMOTRIGINE 100 MG PO TABS
200.0000 mg | ORAL_TABLET | Freq: Once | ORAL | Status: AC
  Administered 2018-06-28: 200 mg via ORAL
  Filled 2018-06-28: qty 2

## 2018-06-28 MED ORDER — LEVETIRACETAM IN NACL 1500 MG/100ML IV SOLN: 1500.0000 mg | Freq: Once | INTRAVENOUS | Status: DC

## 2018-06-28 MED ORDER — SODIUM CHLORIDE 0.9 % IV BOLUS
1000.0000 mL | Freq: Once | INTRAVENOUS | Status: AC
  Administered 2018-06-28: 1000 mL via INTRAVENOUS

## 2018-06-28 MED ORDER — LEVETIRACETAM 1000 MG PO TABS: 2000.0000 mg | ORAL_TABLET | Freq: Two times a day (BID) | ORAL | 0 refills | Status: DC

## 2018-06-28 MED ORDER — KETOROLAC TROMETHAMINE 30 MG/ML IJ SOLN
30.0000 mg | Freq: Once | INTRAMUSCULAR | Status: AC
  Administered 2018-06-28: 30 mg via INTRAVENOUS
  Filled 2018-06-28: qty 1

## 2018-06-28 NOTE — ED Triage Notes (Signed)
Pt brought in by EMS for c/o seizure , witness said he saw her walking and the next thing they noticed was the patient on the ground outside of a prosthetic store ; upon arrival pt was post ictal ; pt now alert and oriented x 4 ; pt does not recall what happened ; pt compliant with keppra and another uknown seizure medication ; abrasion noted under the left eye

## 2018-06-28 NOTE — ED Notes (Signed)
Pt family at bedside

## 2018-06-28 NOTE — ED Notes (Signed)
Pt unable to give urine sample at this time 

## 2018-06-28 NOTE — ED Notes (Signed)
Patient transported to CT 

## 2018-06-28 NOTE — ED Provider Notes (Signed)
[facility] EMERGENCY DEPARTMENT Provider Note   CSN: [id number] Arrival date & time: 06/28/18  1345     History   Chief Complaint Chief Complaint  Patient presents with  . Seizures    HPI Christine Marsh is a 22 y.o. female with history of epilepsy, seasonal allergies, unsteady gait, intellectual disability presents for evaluation of acute onset, resolved seizure-like activity.  Patient's mother answered most questions.  Patient mother states that she was at a doctor's appointment and the patient was downstairs outside when a bystander noticed she was seizing.  Patient's mother states that she thinks she may have seized for approximately 5 minutes and landed in the supine position with her head along the curb of the pavement.  Patient did experience urinary incontinence, no tongue injury.  She was postictal afterwards.  Patient's mother states that this presented with similar to previous seizures.  She states that common triggers of her seizures are heat and stress/anxiety.  She states that the patient has been compliant with her Keppra 2000 mg twice daily as well as Lamictal 25 mg tablets twice daily but ran out of her Lamictal 200 mg tablets twice daily 2 days ago.  She has had difficulty establishing care with an adult neurologist and also has difficulty with transportation.  Presently the patient is complaining of a generalized throbbing headache as well as photophobia.  Patient's mother states that she is currently at her neurologic baseline.  Patient denies vision changes, nausea, vomiting, numbness, tingling, or weakness.  The history is provided by the patient.    Past Medical History:  Diagnosis Date  . Environmental allergies   . Epilepsy (HCC)   . Seasonal allergies     Patient Active Problem List   Diagnosis Date Noted  . 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 Abnormality of gait and mobility 02/15/2017  . Unsteady gait 02/15/2017  . Anxiety  02/25/2016  . Intellectual disability 02/18/2016  . Frequent falls 01/23/2016    Past Surgical History:  Procedure Laterality Date  . LUMBAR PUNCTURE    . TONSILLECTOMY       OB History   None      Home Medications    Prior to Admission medications   Medication Sig Start Date End Date Taking? Authorizing Provider  ALLERGY NON-DROWSY 10 MG tablet TAKE 1 TABLET BY MOUTH EVERY MORNING. 11/12/17   Remus Loffler, PA-C  ibuprofen (ADVIL,MOTRIN) 400 MG tablet Take 1 tablet (400 mg total) by mouth 2 (two) times daily as needed for headache. 02/04/18   Raliegh Ip, DO  lamoTRIgine (LAMICTAL) 200 MG tablet Take 1 tablet (200 mg total) by mouth 2 (two) times daily. 06/28/18   Keirra Zeimet A, PA-C  lamoTRIgine (LAMICTAL) 25 MG tablet Take 2 tablets (50 mg total) by mouth daily. 06/28/18   Elihue Ebert A, PA-C  levETIRAcetam (KEPPRA) 1000 MG tablet Take 2 tablets (2,000 mg total) by mouth 2 (two) times daily. 06/28/18   Jeanie Sewer, PA-C    Family History No family history on file.  Social History Social History   Tobacco Use  . Smoking status: Never Smoker  . Smokeless tobacco: Never Used  Substance Use Topics  . Alcohol use: No  . Drug use: No     Allergies   Oysters [shellfish allergy] and Phenobarbital   Review of Systems Review of Systems  Constitutional: Negative for chills and fever.  Eyes: Positive for photophobia. Negative for visual disturbance.  Respiratory: Negative for  shortness of breath.   Cardiovascular: Negative for chest pain.  Gastrointestinal: Negative for abdominal pain.  Neurological: Positive for seizures and headaches. Negative for weakness and numbness.  All other systems reviewed and are negative.    Physical Exam Updated Vital Signs BP 112/68 (BP Location: Right Arm)   Pulse 93   Temp 98.4 F (36.9 C) (Oral)   Resp 16   Ht 5\' 5"  (1.651 m)   Wt 72.6 kg (160 lb)   SpO2 100%   BMI 26.63 kg/m   Physical Exam  Constitutional: She  appears well-developed and well-nourished. No distress.  HENT:  Head: Normocephalic.  No battle signs, no raccoon eyes, no rhinorrhea.  No hemotympanum bilaterally.  Superficial laceration to the left lower eyelid and left nare, bleeding controlled.  Generalized tenderness to palpation of the scalp with no underlying crepitus, ecchymosis, deformity, or bleeding.  No tenderness to palpation of the face.  No intraoral injury.  Dentition appears stable.  Eyes: Pupils are equal, round, and reactive to light. Conjunctivae and EOM are normal. Right eye exhibits no discharge. Left eye exhibits no discharge.  Pupils dilated, 7 mm bilaterally, no pain with EOMs  Neck: Normal range of motion. Neck supple. No JVD present. No tracheal deviation present.  No midline spine TTP, no paraspinal muscle tenderness, no deformity, crepitus, or step-off noted   Cardiovascular: Normal rate, regular rhythm, normal heart sounds and intact distal pulses.  Pulmonary/Chest: Effort normal and breath sounds normal. No stridor. No respiratory distress. She has no wheezes. She has no rales. She exhibits no tenderness.  Abdominal: Soft. Bowel sounds are normal. She exhibits no distension. There is no tenderness. There is no guarding.  Musculoskeletal: Normal range of motion. She exhibits no edema.  No midline spine TTP, no paraspinal muscle tenderness, no deformity, crepitus, or step-off noted.  5/5 strength of BUE and BLE major muscle groups.  Neurological: She is alert. No cranial nerve deficit or sensory deficit. Coordination abnormal.  Fluent speech with no evidence of dysarthria or aphasia.  No facial droop.  Oriented to person but not place, time, or events. Aware that Garnet KoyanagiDonald Trump is the president.  Appears emotionally labile, occasionally giggling randomly.  Cranial nerves II through XII tested and intact.  Able to pull herself up to a standing position without difficulty.  Ambulates with an unsteady gait.  Patient's mother  states this is her baseline and is unchanged.  Follows some commands but not others.  Able to follow simple commands but not two-step commands.  Skin: Skin is warm and dry. No erythema.  Areas of ecchymosis noted to the extremities in various stages of healing  Psychiatric: She has a normal mood and affect. Her behavior is normal.  Nursing note and vitals reviewed.    ED Treatments / Results  Labs (all labs ordered are listed, but only abnormal results are displayed) Labs Reviewed  CBC WITH DIFFERENTIAL/PLATELET - Abnormal; Notable for the following components:      Result Value   Neutro Abs 7.8 (*)    All other components within normal limits  VALPROIC ACID LEVEL - Abnormal; Notable for the following components:   Valproic Acid Lvl <10 (*)    All other components within normal limits  BASIC METABOLIC PANEL  RAPID URINE DRUG SCREEN, HOSP PERFORMED  URINALYSIS, ROUTINE W REFLEX MICROSCOPIC  LAMOTRIGINE LEVEL  LEVETIRACETAM LEVEL  CBG MONITORING, ED  I-STAT BETA HCG BLOOD, ED ([facility], [facility], [facility] ONLY)    EKG None  Radiology Ct  Head Wo Contrast  Result Date: 06/28/2018 CLINICAL DATA:  Posttraumatic headache after seizure EXAM: CT HEAD WITHOUT CONTRAST TECHNIQUE: Contiguous axial images were obtained from the base of the skull through the vertex without intravenous contrast. COMPARISON:  08/23/2015 FINDINGS: BRAIN: The ventricles and sulci are normal. No intraparenchymal hemorrhage, mass effect nor midline shift. No acute large vascular territory infarcts. Grey-white matter distinction is maintained. The basal ganglia are unremarkable. No abnormal extra-axial fluid collections. Basal cisterns are not effaced and midline. The brainstem and cerebellar hemispheres are without acute abnormalities. VASCULAR: Unremarkable. SKULL/SOFT TISSUES: No skull fracture. No significant soft tissue swelling. ORBITS/SINUSES: The included ocular globes and orbital contents are normal.The mastoid air cells are  clear. The included paranasal sinuses are well-aerated. OTHER: None. IMPRESSION: Normal head CT without acute intracranial abnormalities. Electronically Signed   By: Tollie Eth M.D.   On: 06/28/2018 17:49    Procedures Procedures (including critical care time)  Medications Ordered in ED Medications  sodium chloride 0.9 % bolus 1,000 mL (0 mLs Intravenous Stopped 06/28/18 1602)    And  0.9 %  sodium chloride infusion (has no administration in time range)  ketorolac (TORADOL) 30 MG/ML injection 30 mg (has no administration in time range)  lamoTRIgine (LAMICTAL) tablet 200 mg (has no administration in time range)     Initial Impression / Assessment and Plan / ED Course  I have reviewed the triage vital signs and the nursing notes.  Pertinent labs & imaging results that were available during my care of the patient were reviewed by me and considered in my medical decision making (see chart for details).     Patient presents after seizure earlier today while in the heat.  She is afebrile, vital signs are stable.  She is nontoxic in appearance.  She has a history of intellectual disability but mother states that she is at her mental status baseline.  She has difficulty ambulating at baseline which according to the mother is no worse today.  Otherwise no focal neurologic deficits.  Lab work reviewed by me shows no leukocytosis, no anemia, no metabolic derangements.  CT head without acute abnormality.  Doubt skull fracture, ICH, SAH, or intracranial mass.  Patient's mother states that she has seizures frequently at least on a monthly basis.  Suspect breakthrough seizures since the patient has not had her usual dose of Lamictal for 2 days.  She was given her usual home dose in the ED as well as IV fluids and Toradol for headache with improvement in her symptoms.  Case management was consulted and will help in medication assistance and follow-up with PCP within the free clinic system who will in turn  help set up appointment with adult neurology on an outpatient basis.  We will give refills of the patient's home medicines.  On reevaluation, patient resting comfortably no apparent distress, tolerating p.o. food and fluids without difficulty.  Stable for discharge home with follow-up in the free clinic.  Discussed strict ED return precautions.  Patient's mother verbalized understanding of and agreement with plan and patient is stable for discharge home at this time.  Discussed with Dr. Clayborne Dana who agrees with assessment plan at this time.  Final Clinical Impressions(s) / ED Diagnoses   Final diagnoses:  Seizure [facility])    ED Discharge Orders        Ordered    lamoTRIgine (LAMICTAL) 200 MG tablet  2 times daily     06/28/18 1925    lamoTRIgine (LAMICTAL) 25 MG tablet  Daily     06/28/18 1925    levETIRAcetam (KEPPRA) 1000 MG tablet  2 times daily     06/28/18 1925       Bennye Alm 06/28/18 1954    Mesner, Barbara Cower, MD 06/28/18 2108

## 2018-06-28 NOTE — ED Notes (Signed)
When PA was ambulating pt, pt was unsteady on feet. Pt and pts mom states this has always been pts baseline when ambulating

## 2018-06-28 NOTE — Discharge Instructions (Signed)
Take your home medications as prescribed.  Follow-up with neurology for reevaluation.  We have given you information for the free clinic and rocking him which can help facilitate follow-up with an adult neurologist.  Return to the emergency department if any concerning signs or symptoms develop such as persistent seizures, high fevers, weakness, passing out, facial droop, or slurred speech.

## 2018-06-28 NOTE — ED Notes (Signed)
Pt and mother verbally indicated understanding of discharge Instructions/follow up and prescriptions.

## 2018-06-29 LAB — LEVETIRACETAM LEVEL: Levetiracetam Lvl: 33.8 ug/mL (ref 10.0–40.0)

## 2018-06-29 LAB — LAMOTRIGINE LEVEL: Lamotrigine Lvl: 4 ug/mL (ref 2.0–20.0)

## 2018-08-30 ENCOUNTER — Telehealth: Payer: Self-pay | Admitting: Physician Assistant

## 2018-08-30 DIAGNOSIS — G40909 Epilepsy, unspecified, not intractable, without status epilepticus: Secondary | ICD-10-CM

## 2018-08-30 MED ORDER — LAMOTRIGINE 25 MG PO TABS: 50.0000 mg | ORAL_TABLET | Freq: Every day | ORAL | 0 refills | Status: AC

## 2018-08-30 MED ORDER — LAMOTRIGINE 200 MG PO TABS: 200.0000 mg | ORAL_TABLET | Freq: Two times a day (BID) | ORAL | 0 refills | Status: AC

## 2018-08-30 MED ORDER — LEVETIRACETAM 1000 MG PO TABS: 2000.0000 mg | ORAL_TABLET | Freq: Two times a day (BID) | ORAL | 0 refills | Status: AC

## 2018-08-30 NOTE — Telephone Encounter (Signed)
Use, can she get the medication or not.  If she can it is okay to send each one one time but then she does need a appointment.

## 2018-08-30 NOTE — Telephone Encounter (Signed)
Called patient but did not get an answer. Left message that we sent in Keppra and lamictal to [facility] and that she will need to call back to schedule an appointment. I am unsure of the problem she was having with the refills previously. She may not be able to pick them up if it is still a problem.

## 2018-11-18 ENCOUNTER — Ambulatory Visit: Payer: Medicaid Other | Admitting: Physician Assistant

## 2018-12-30 ENCOUNTER — Ambulatory Visit: Payer: Medicare Other | Admitting: Physician Assistant

## 2019-01-17 ENCOUNTER — Ambulatory Visit: Payer: Medicare Other | Admitting: Physician Assistant

## 2019-01-31 ENCOUNTER — Ambulatory Visit: Payer: Medicare Other | Admitting: Physician Assistant
